# Patient Record
Sex: Male | Born: 1965 | Race: White | Hispanic: No | Marital: Single | Smoking: Former smoker
Health system: Southern US, Community
[De-identification: ages and names within clinical notes are randomized; demographics above are authoritative.]

## PROBLEM LIST (undated history)

## (undated) DIAGNOSIS — I10 Essential (primary) hypertension: Secondary | ICD-10-CM

## (undated) DIAGNOSIS — J45909 Unspecified asthma, uncomplicated: Secondary | ICD-10-CM

## (undated) HISTORY — PX: TONSILLECTOMY: SUR1361

## (undated) HISTORY — PX: MYRINGOTOMY: SUR874

## (undated) HISTORY — PX: ADENOIDECTOMY: SUR15

---

## 2009-07-26 ENCOUNTER — Emergency Department (HOSPITAL_COMMUNITY): Admission: EM | Admit: 2009-07-26 | Discharge: 2009-07-27 | Payer: Self-pay | Admitting: Emergency Medicine

## 2011-05-18 ENCOUNTER — Emergency Department (HOSPITAL_COMMUNITY): Payer: Self-pay

## 2011-05-18 ENCOUNTER — Observation Stay (HOSPITAL_COMMUNITY)
Admission: EM | Admit: 2011-05-18 | Discharge: 2011-05-18 | Disposition: A | Discharge status: Home / Self Care | Payer: Self-pay | Attending: Emergency Medicine | Admitting: Emergency Medicine

## 2011-05-18 DIAGNOSIS — R079 Chest pain, unspecified: Secondary | ICD-10-CM | POA: Insufficient documentation

## 2011-05-18 DIAGNOSIS — K2100 Gastro-esophageal reflux disease with esophagitis, without bleeding: Principal | ICD-10-CM | POA: Insufficient documentation

## 2011-05-18 LAB — POCT I-STAT TROPONIN I: Troponin i, poc: 0.03 ng/mL (ref 0.00–0.08)

## 2011-05-18 LAB — DIFFERENTIAL
Eosinophils Relative: 3 % (ref 0–5)
Lymphocytes Relative: 16 % (ref 12–46)
Lymphs Abs: 1.2 10*3/uL (ref 0.7–4.0)
Monocytes Relative: 9 % (ref 3–12)

## 2011-05-18 LAB — CBC
HCT: 46.2 % (ref 39.0–52.0)
MCV: 95.3 fL (ref 78.0–100.0)
RBC: 4.85 MIL/uL (ref 4.22–5.81)
RDW: 12.1 % (ref 11.5–15.5)
WBC: 7.1 10*3/uL (ref 4.0–10.5)

## 2011-05-18 LAB — COMPREHENSIVE METABOLIC PANEL
BUN: 13 mg/dL (ref 6–23)
CO2: 25 mEq/L (ref 19–32)
Chloride: 105 mEq/L (ref 96–112)
Creatinine, Ser: 0.95 mg/dL (ref 0.50–1.35)
GFR calc non Af Amer: >90 mL/min (ref >90)
Total Bilirubin: 1.2 mg/dL (ref 0.3–1.2)

## 2011-05-18 LAB — LIPASE, BLOOD: Lipase: 7 U/L — ABNORMAL LOW (ref 11–59)

## 2011-05-18 IMAGING — CT CT HEART MORP W/ CTA COR W/ SCORE W/ CA W/CM &/OR W/O CM
2 of 4 series · 11 of 20 positions shown, 12 images · IV contrast (omnipaque)
Comparison: 05/18/2011 chest radiograph.

***ADDENDUM*** CREATED: 05/18/2011 [DATE]

I have reviewed these images in PACs and on the [employer] workstation, and agree with Dr. Vacante
interpretation.
INDICATION: Chest pain.  Acute coronary syndrome
CT ANGIOGRAPHY OF THE HEART, CORONARY ARTERY, STRUCTURE, AND
MORPHOLOGY
CONTRAST: 80mL OMNIPAQUE IOHEXOL 350 MG/ML IV SOLN
TECHNIQUE: CT angiography of the coronary vessels was performed on
a 256 channel system using prospective ECG gating.  A scout and
noncontrast exam (for calcium scoring) were performed.  Circulation
time was measured using a test bolus.  Coronary CTA was performed
with sub mm slice collimation during portions of the cardiac cycle
after prior injection of iodinated contrast.  Imaging post
processing was performed on an independent workstation creating
multiplanar and 3-D images, and quantitative analysis of the heart
and coronary arteries.  Note that this exam targets the heart and
the chest was not imaged in its entirety.
PREMEDICATION:
Lopressor 50 mg, P.O.
Lopressor five mg, IV
Nitroglycerin 0.4 mcg, sublingual.

[Series 2: calcium score · axial · 0.49mm/px · z∈[-233,-115]mm · 3 of 48 slices shown, 4 images]
[im 1/48  vessel]
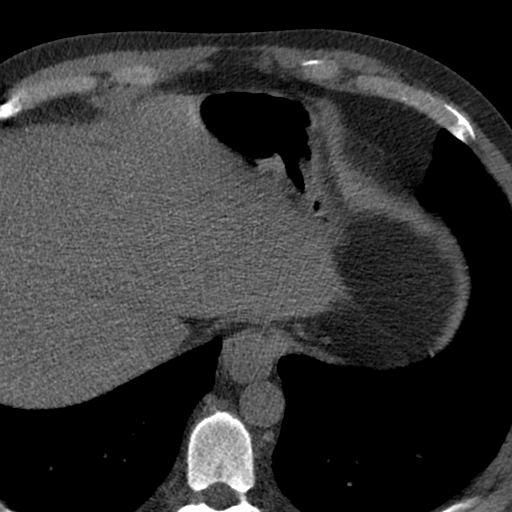
[im 1/48  lung]
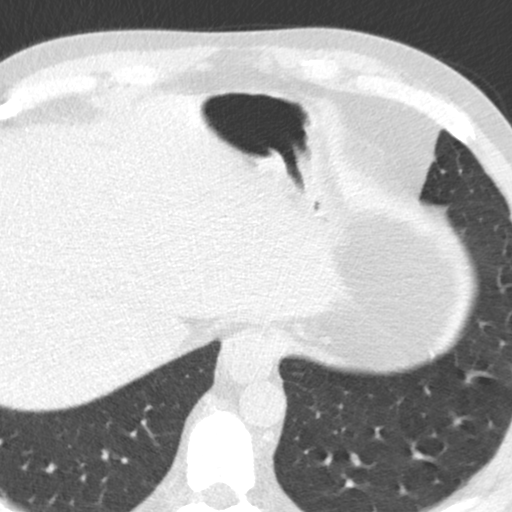
[im 24/48  vessel]
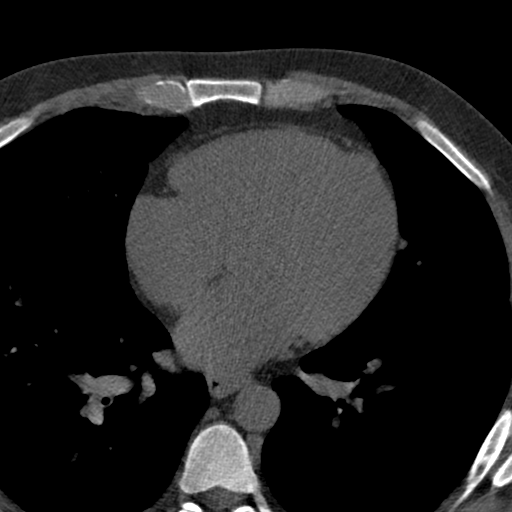
[im 48/48  vessel]
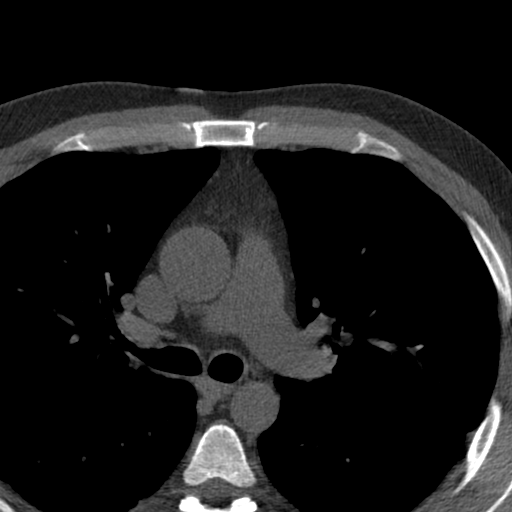

[Series 10: w/o edge corr., 78.0% · axial · non-contrast · 0.49mm/px · z∈[-226,-123]mm · 8 of 276 slices shown]
[im 23/276  lung]
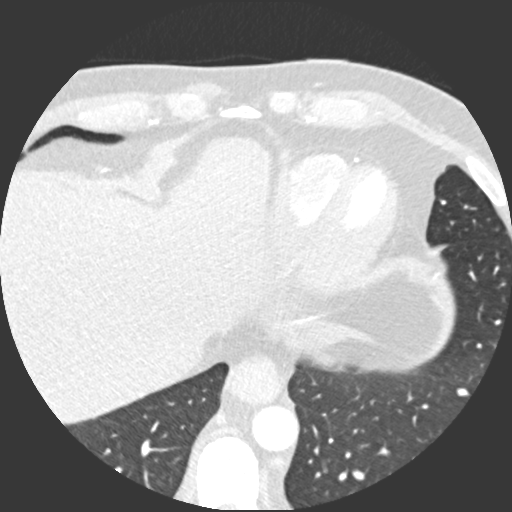
[im 69/276  lung]
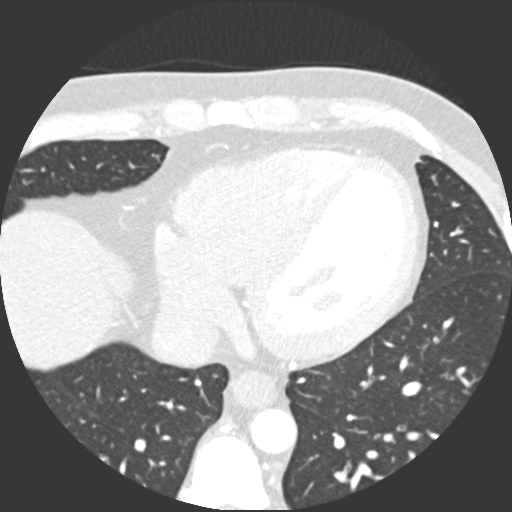
[im 92/276  lung]
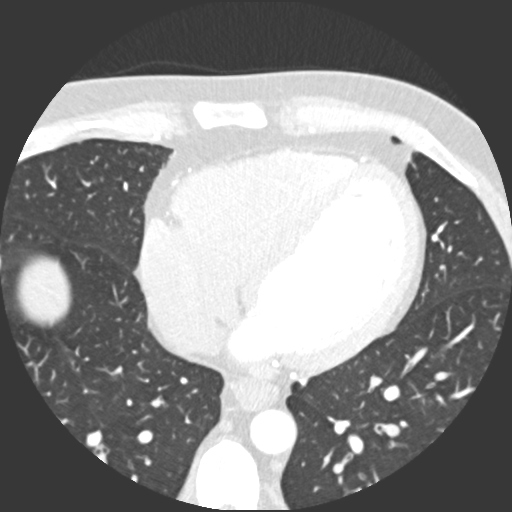
[im 115/276  lung]
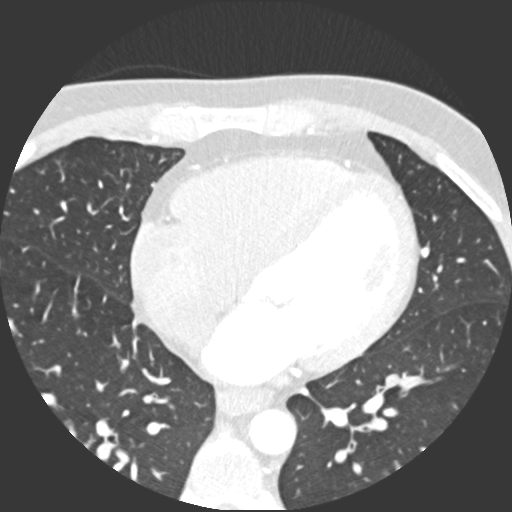
[im 161/276  lung]
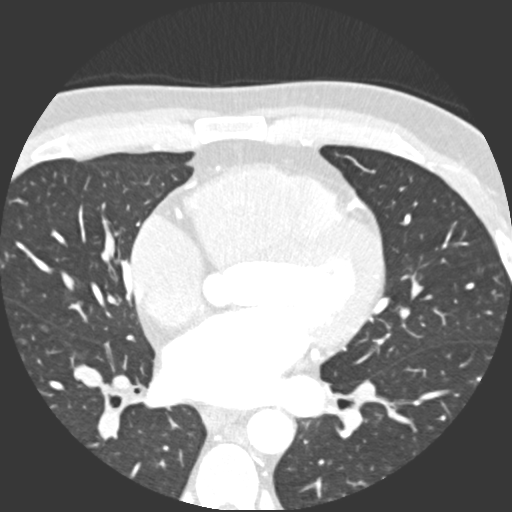
[im 184/276  lung]
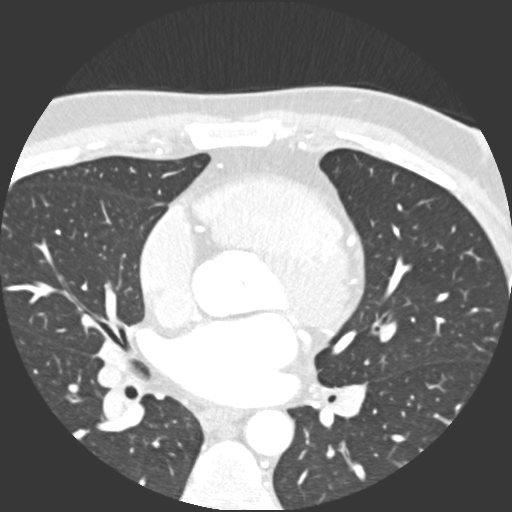
[im 207/276  lung]
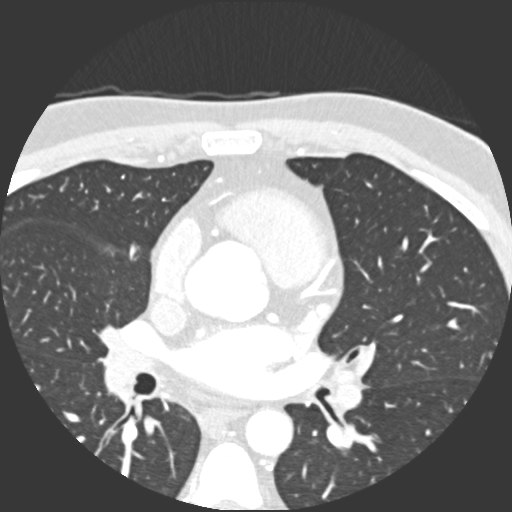
[im 253/276  lung]
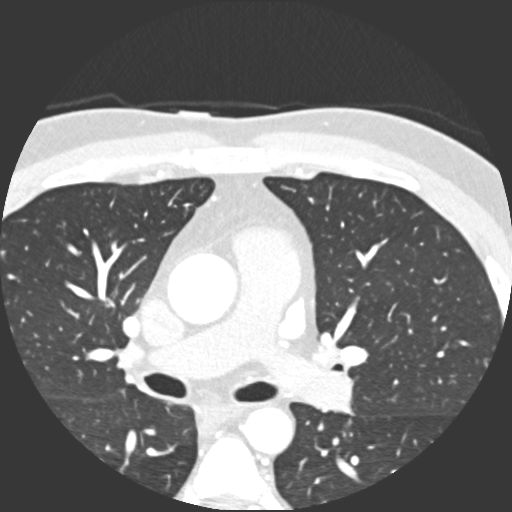

[11 of 20 positions shown; findings below may reference images not displayed]

FINDINGS: Technical quality:  Excellent.

Heart rate:  51

CORONARY ARTERIES:
Left main coronary artery:  Widely patent.
Left anterior descending:  Widely patent.
Left circumflex:  Widely patent.
Right coronary artery:  Diminutive but widely patent.
Posterior descending artery:  Widely patent.
Dominance:  Left

CORONARY CALCIUM:
Total Agatston Score:
[HOSPITAL] percentile:  16

CARDIAC MEASUREMENTS:
Interventricular septum (6 - 12 mm):  9 mm
LV posterior wall (6 - 12 mm):  9 mm
LV diameter in diastole (35 - 52 mm):  44 mm

AORTA AND PULMONARY MEASUREMENTS:
Aortic root (21 - 40 mm):
            29 mm  at the annulus
            39 mm  at the sinuses of Valsalva
            28 mm.  at the sinotubular junction
Ascending aorta ( <  40 mm):  30 mm

Descending aorta ( <  40 mm):  21 mm
Main pulmonary artery:  ( <  30 mm):  26 mm

EXTRACARDIAC FINDINGS:
Scattered areas of atelectasis in the lungs.  Thoracic spine
appears within normal limits.  No pericardial or pleural effusion
is identified.  Small hiatal hernia with thickening of the distal
esophagus, suggesting reflux esophagitis.  Consider endoscopic
correlation.
IMPRESSION: 1.  No significant coronary artery disease.  The patient's total
coronary artery calcium score is 0.1, which is 16 percentile for
patient's matched age and gender.
2.  Widely patent coronary arteries.  Thickening of the distal
esophagus with a small hiatal hernia.  This raises the possibility
of reflux esophagitis.  Consider endoscopic correlation.
3.  Left coronary artery dominance.

Report was called to Dr. Peachy Tia at 1885 hours on 05/18/2011.

## 2011-05-18 IMAGING — CR DG CHEST 2V
2 series · 2 of 2 positions shown · non-contrast
Comparison: None.

CLINICAL DATA: 45-year-old male with chest pain, vomiting, tingling
into the left shoulder.

CHEST - 2 VIEW

[w chest pa]
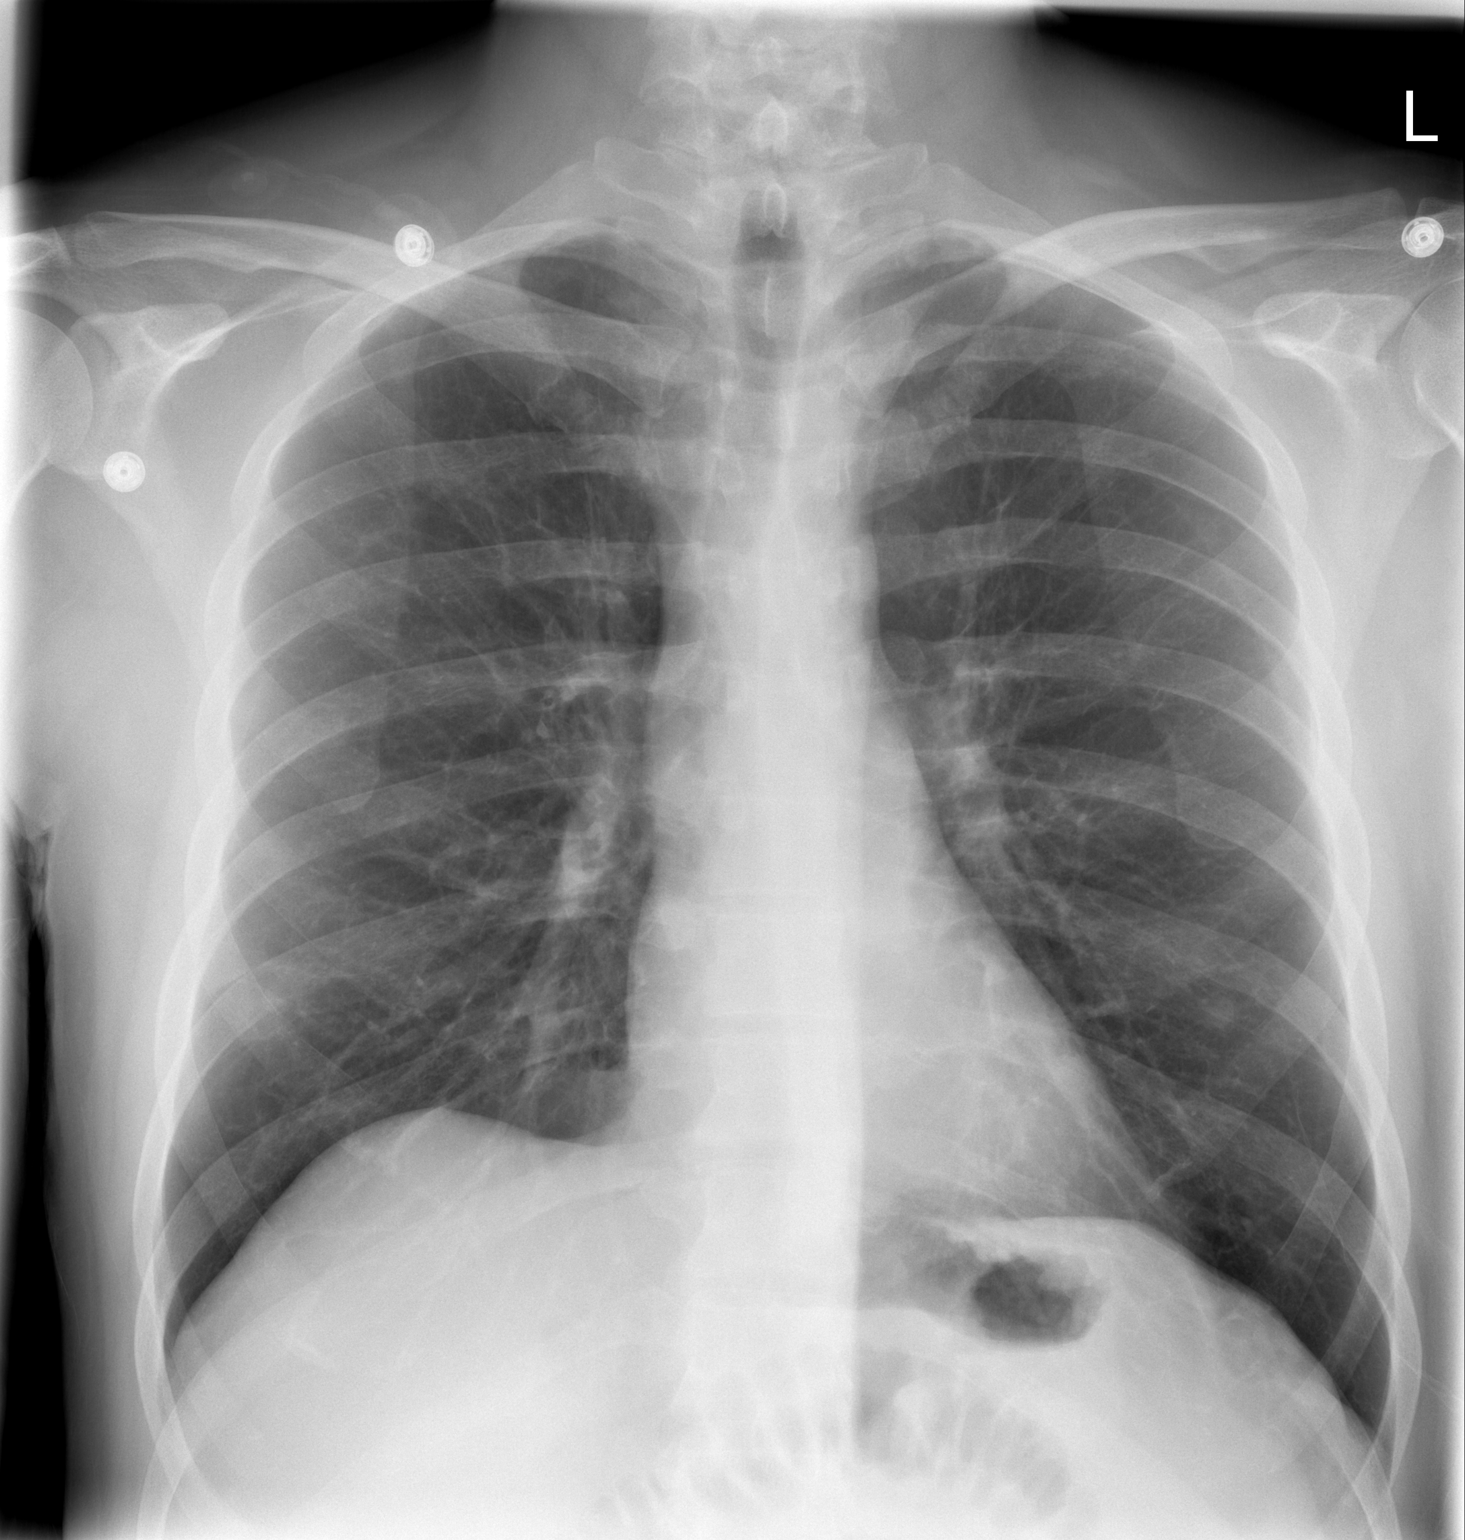

[w chest lat]
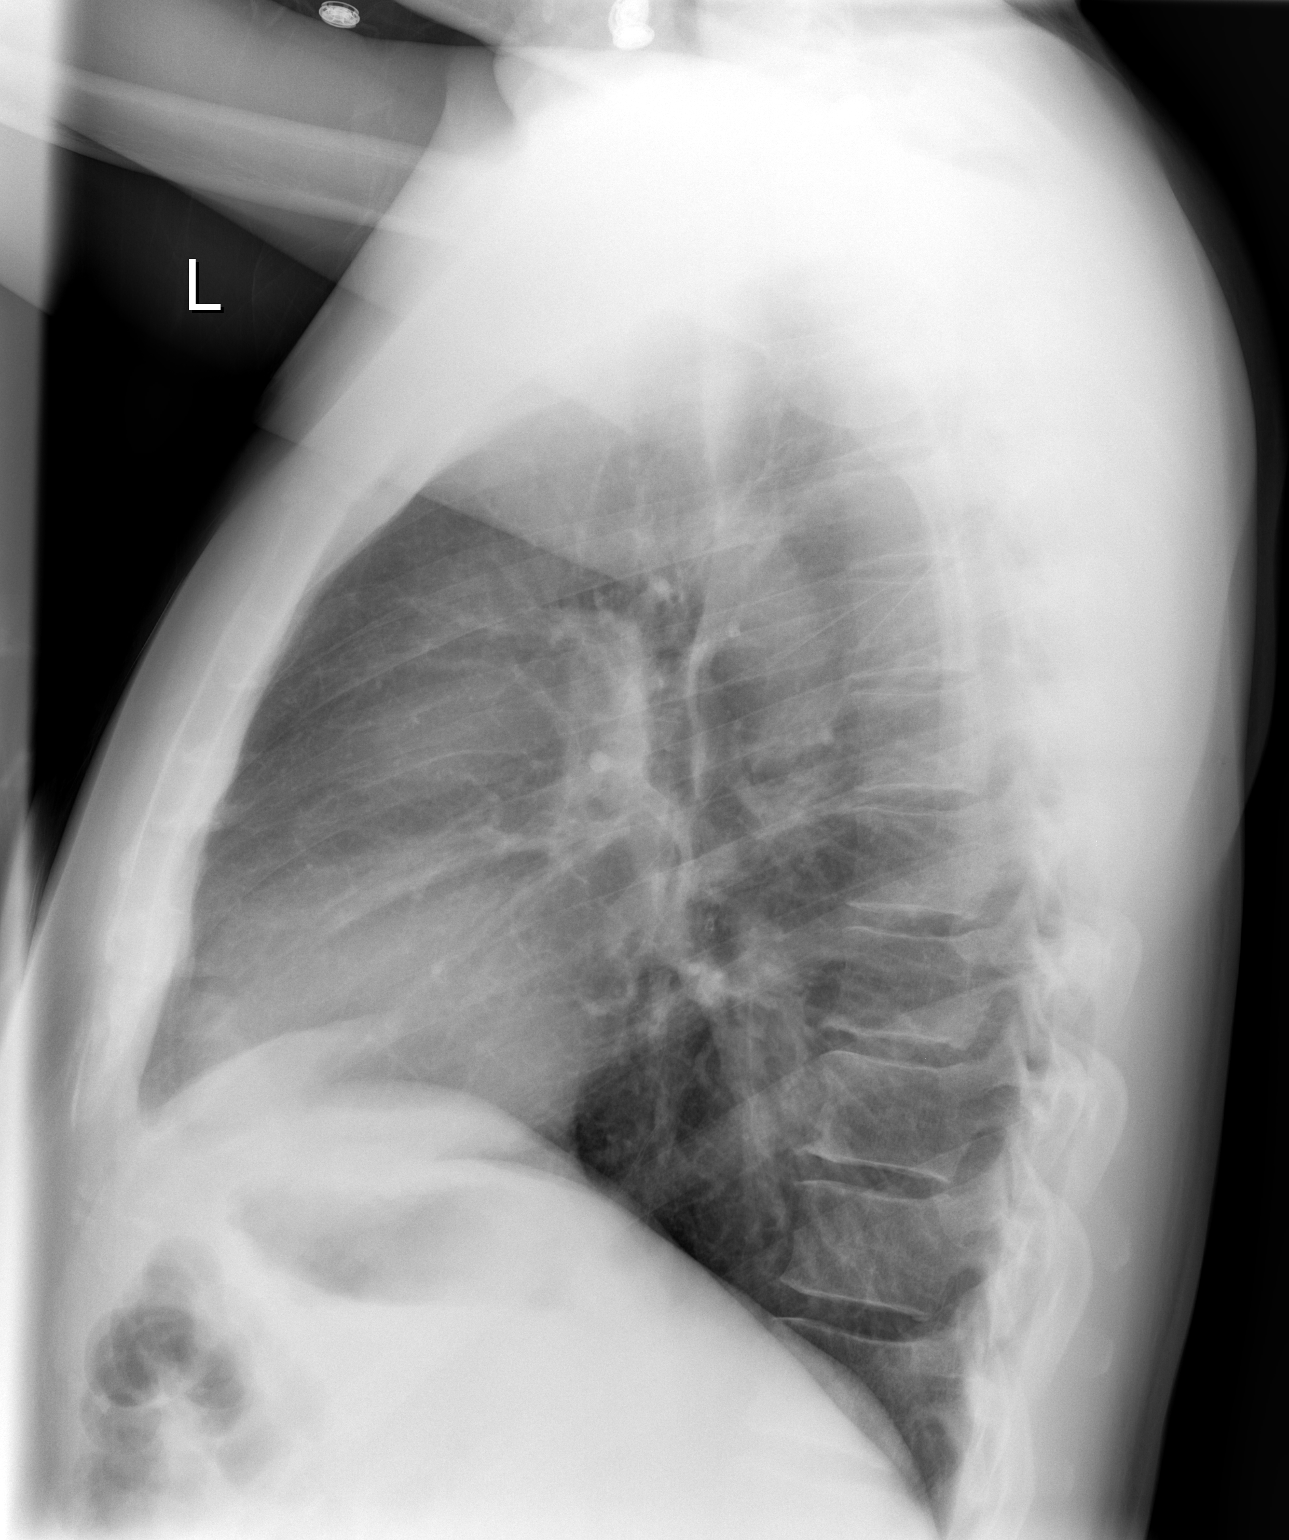

[2 of 2 positions shown; findings below may reference images not displayed]

FINDINGS: Bilateral nipple shadows suspected.  Lung volumes within
normal limits.  Mild elevation of the right hemidiaphragm. Normal
cardiac size and mediastinal contours.  Visualized tracheal air
column is within normal limits.  No pneumothorax, pulmonary edema,
pleural effusion or confluent pulmonary opacity. No acute osseous
abnormality identified.
IMPRESSION: No acute cardiopulmonary abnormality.  Bilateral nipple shadows
suspected.

## 2011-05-18 MED ORDER — IOHEXOL 350 MG/ML SOLN
80.0000 mL | Freq: Once | INTRAVENOUS | Status: AC | PRN
  Administered 2011-05-18: 80 mL via INTRAVENOUS

## 2013-11-19 ENCOUNTER — Encounter (HOSPITAL_COMMUNITY): Payer: Self-pay | Admitting: Emergency Medicine

## 2013-11-19 ENCOUNTER — Emergency Department (HOSPITAL_COMMUNITY): Payer: Self-pay

## 2013-11-19 ENCOUNTER — Emergency Department (HOSPITAL_COMMUNITY)
Admission: EM | Admit: 2013-11-19 | Discharge: 2013-11-19 | Disposition: A | Discharge status: Home / Self Care | Payer: Self-pay | Attending: Emergency Medicine | Admitting: Emergency Medicine

## 2013-11-19 DIAGNOSIS — R202 Paresthesia of skin: Secondary | ICD-10-CM

## 2013-11-19 DIAGNOSIS — R071 Chest pain on breathing: Secondary | ICD-10-CM | POA: Insufficient documentation

## 2013-11-19 DIAGNOSIS — R209 Unspecified disturbances of skin sensation: Secondary | ICD-10-CM | POA: Insufficient documentation

## 2013-11-19 DIAGNOSIS — Z87891 Personal history of nicotine dependence: Secondary | ICD-10-CM | POA: Insufficient documentation

## 2013-11-19 DIAGNOSIS — Z792 Long term (current) use of antibiotics: Secondary | ICD-10-CM | POA: Insufficient documentation

## 2013-11-19 DIAGNOSIS — IMO0002 Reserved for concepts with insufficient information to code with codable children: Secondary | ICD-10-CM | POA: Insufficient documentation

## 2013-11-19 DIAGNOSIS — J45909 Unspecified asthma, uncomplicated: Secondary | ICD-10-CM | POA: Insufficient documentation

## 2013-11-19 DIAGNOSIS — R0789 Other chest pain: Secondary | ICD-10-CM

## 2013-11-19 DIAGNOSIS — I10 Essential (primary) hypertension: Secondary | ICD-10-CM | POA: Insufficient documentation

## 2013-11-19 HISTORY — DX: Unspecified asthma, uncomplicated: J45.909

## 2013-11-19 HISTORY — DX: Essential (primary) hypertension: I10

## 2013-11-19 LAB — CBC
HCT: 44.3 % (ref 39.0–52.0)
Hemoglobin: 15.6 g/dL (ref 13.0–17.0)
MCH: 35.5 pg — ABNORMAL HIGH (ref 26.0–34.0)
MCHC: 35.2 g/dL (ref 30.0–36.0)
MCV: 100.7 fL — AB (ref 78.0–100.0)
PLATELETS: 268 10*3/uL (ref 150–400)
RBC: 4.4 MIL/uL (ref 4.22–5.81)
RDW: 11.8 % (ref 11.5–15.5)
WBC: 9.8 10*3/uL (ref 4.0–10.5)

## 2013-11-19 LAB — HEPATIC FUNCTION PANEL
ALBUMIN: 4.1 g/dL (ref 3.5–5.2)
ALK PHOS: 80 U/L (ref 39–117)
ALT: 33 U/L (ref 0–53)
AST: 29 U/L (ref 0–37)
BILIRUBIN TOTAL: 0.2 mg/dL — AB (ref 0.3–1.2)
Bilirubin, Direct: <0.2 mg/dL (ref 0.0–0.3)
TOTAL PROTEIN: 7.2 g/dL (ref 6.0–8.3)

## 2013-11-19 LAB — D-DIMER, QUANTITATIVE: D-Dimer, Quant: 0.27 ug/mL-FEU (ref 0.00–0.48)

## 2013-11-19 LAB — BASIC METABOLIC PANEL
BUN: 9 mg/dL (ref 6–23)
CHLORIDE: 100 meq/L (ref 96–112)
CO2: 20 meq/L (ref 19–32)
CREATININE: 0.77 mg/dL (ref 0.50–1.35)
Calcium: 8.8 mg/dL (ref 8.4–10.5)
GFR calc non Af Amer: >90 mL/min (ref >90)
Glucose, Bld: 134 mg/dL — ABNORMAL HIGH (ref 70–99)
POTASSIUM: 3.7 meq/L (ref 3.7–5.3)
SODIUM: 137 meq/L (ref 137–147)

## 2013-11-19 LAB — I-STAT TROPONIN, ED
TROPONIN I, POC: 0.01 ng/mL (ref 0.00–0.08)
TROPONIN I, POC: 0.01 ng/mL (ref 0.00–0.08)

## 2013-11-19 LAB — ETHANOL: ALCOHOL ETHYL (B): 221 mg/dL — AB (ref 0–11)

## 2013-11-19 IMAGING — CR DG CHEST 2V
2 series · 2 of 2 positions shown · non-contrast
Comparison: Chest radiograph and coronary CTA performed 05/18/2011

CLINICAL DATA: Sudden onset of chest pain. Left hand and arm
numbness.

EXAM:
CHEST  2 VIEW

[w chest pa]
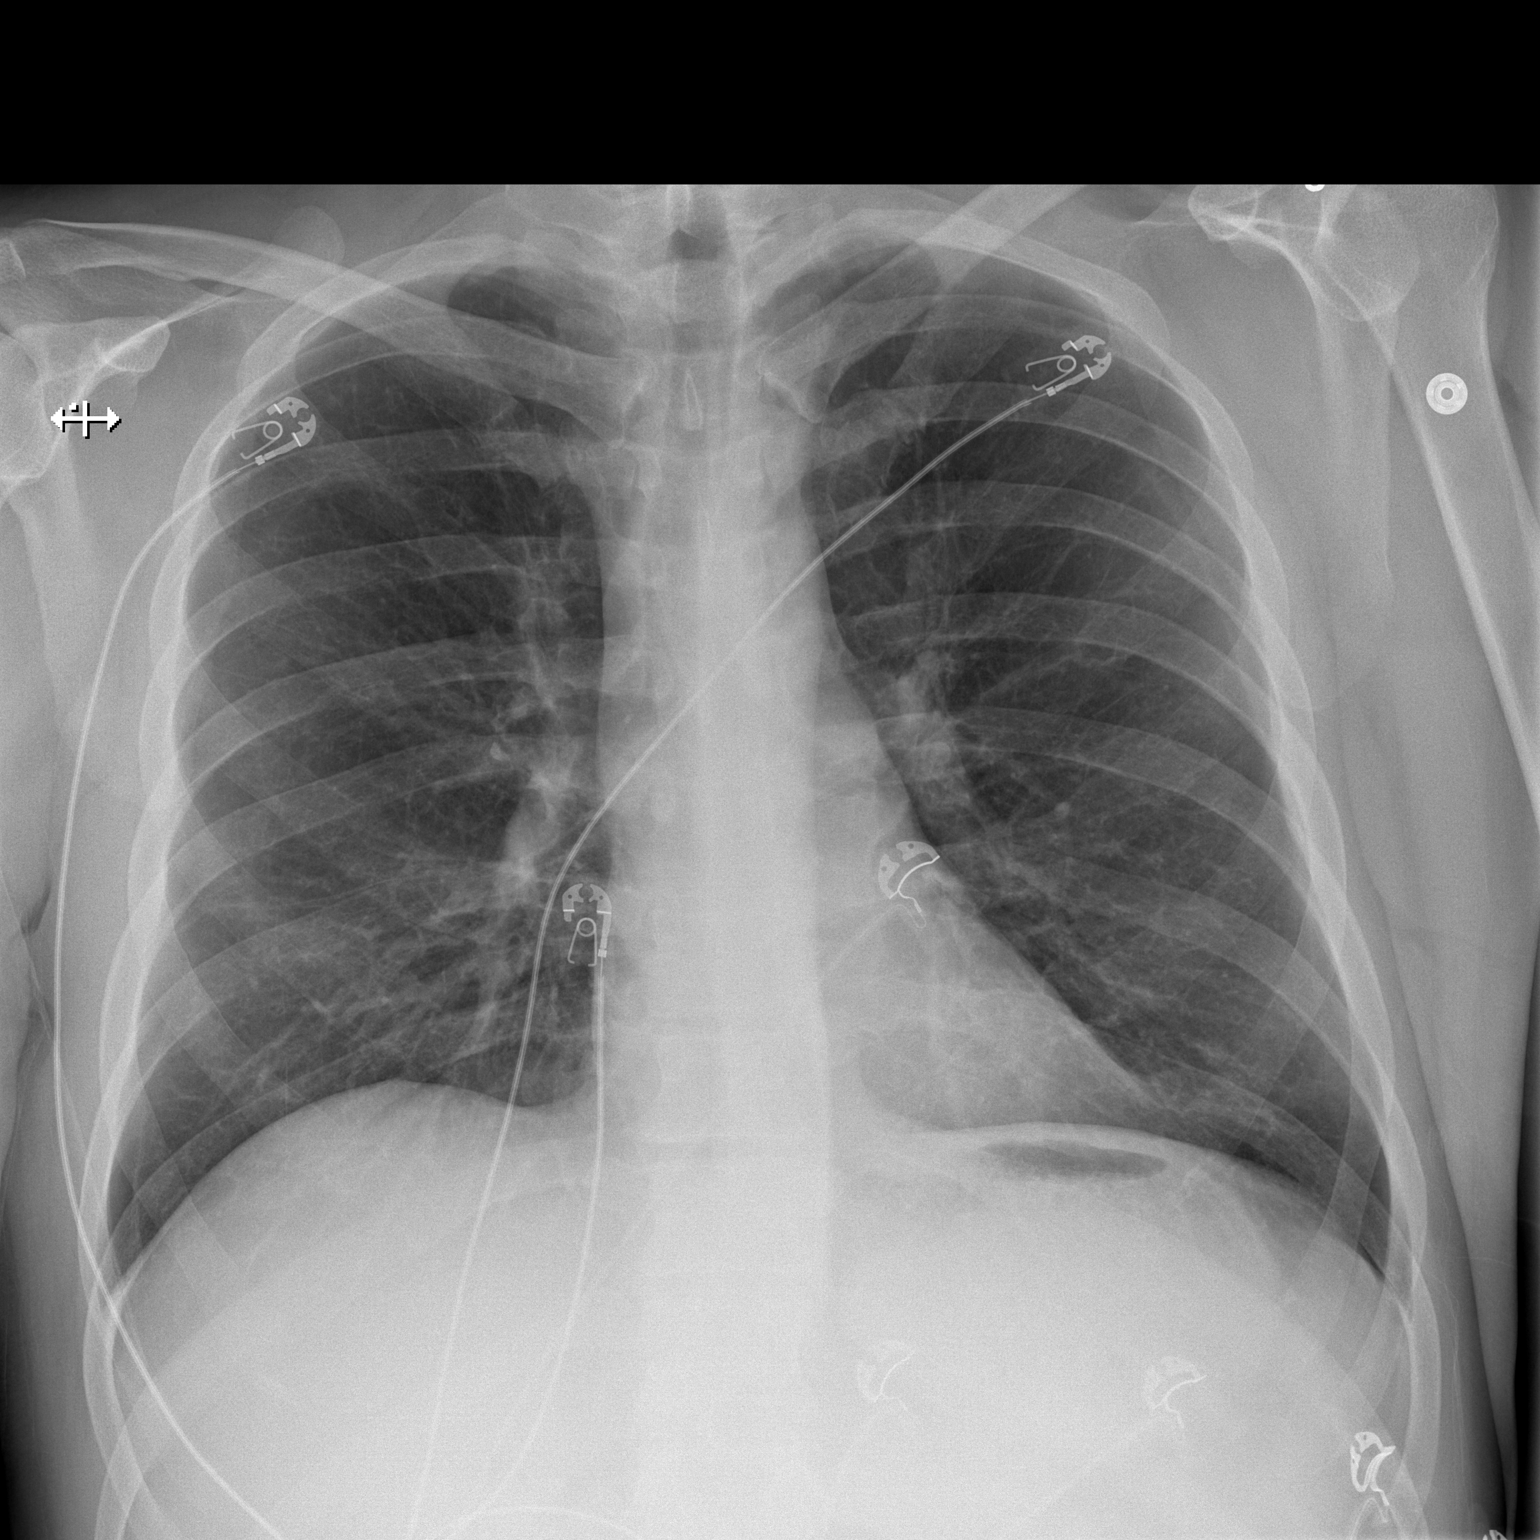

[w chest lat]
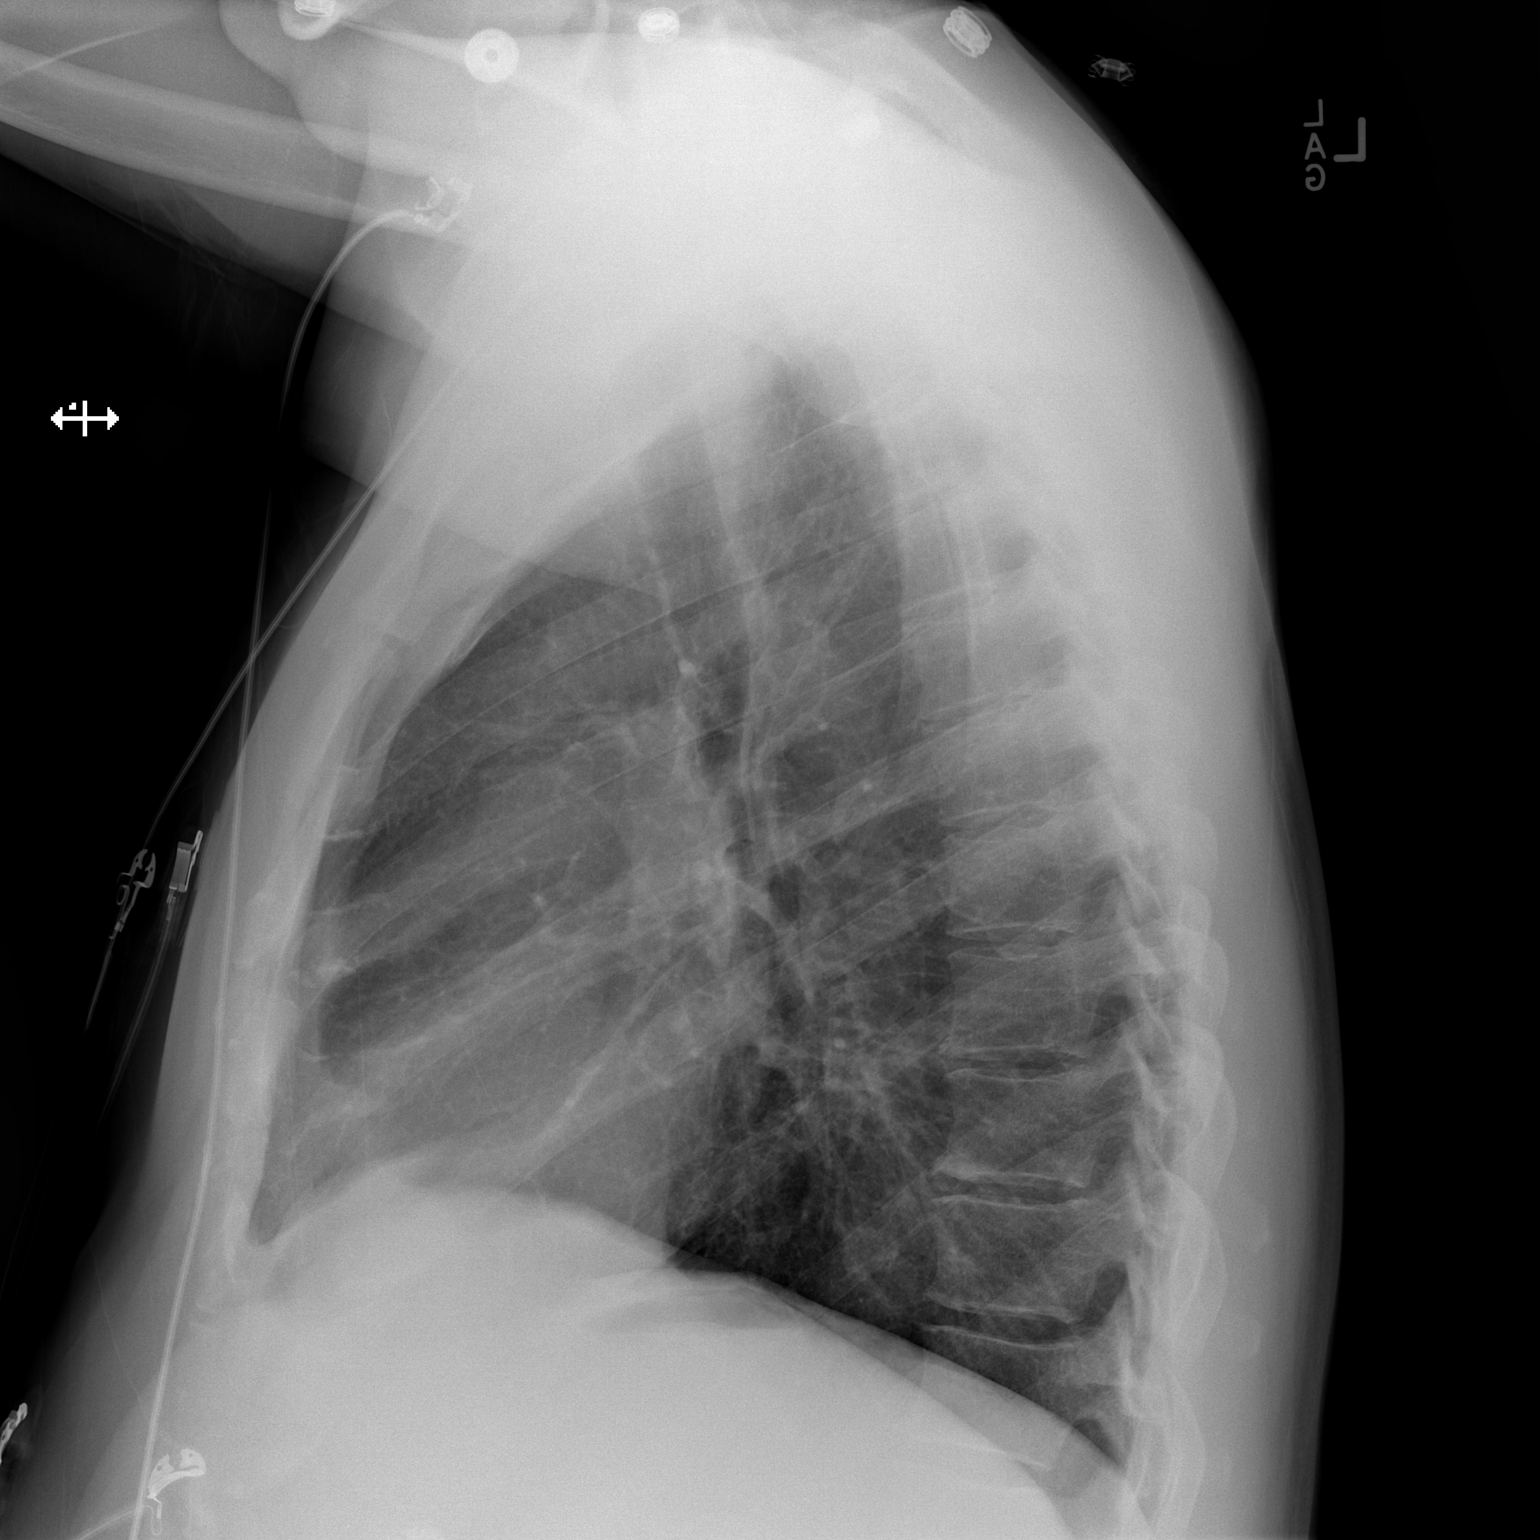

[2 of 2 positions shown; findings below may reference images not displayed]

FINDINGS: The lungs are well-aerated and clear. There is no evidence of focal
opacification, pleural effusion or pneumothorax.

The heart is normal in size; the mediastinal contour is within
normal limits. No acute osseous abnormalities are seen.
IMPRESSION: No acute cardiopulmonary process seen. No displaced rib fractures
identified.

## 2013-11-19 MED ORDER — METHOCARBAMOL 500 MG PO TABS: 500.0000 mg | ORAL_TABLET | Freq: Three times a day (TID) | ORAL | Status: AC | PRN

## 2013-11-19 MED ORDER — IBUPROFEN 600 MG PO TABS: 600.0000 mg | ORAL_TABLET | Freq: Four times a day (QID) | ORAL | Status: AC | PRN

## 2013-11-19 MED ORDER — KETOROLAC TROMETHAMINE 30 MG/ML IJ SOLN
30.0000 mg | Freq: Once | INTRAMUSCULAR | Status: AC
  Administered 2013-11-19: 30 mg via INTRAVENOUS
  Filled 2013-11-19: qty 1

## 2013-11-19 MED ORDER — ALBUTEROL SULFATE (2.5 MG/3ML) 0.083% IN NEBU
5.0000 mg | INHALATION_SOLUTION | Freq: Once | RESPIRATORY_TRACT | Status: AC
  Administered 2013-11-19: 5 mg via RESPIRATORY_TRACT
  Filled 2013-11-19: qty 6

## 2013-11-19 NOTE — ED Notes (Addendum)
Pt presents to ED with c/o chest pain with numbness to left hand.  Pt reports that he started having "sharp, squeezing feeling" to left chest 20 minutes ago.  Pt also reports shortness of breath associated with chest pain.

## 2013-11-19 NOTE — Discharge Instructions (Signed)
Followup with a cardiologist as needed. Take pain medications as prescribed. If the numbness in your left hand continues to see a Hydrographic surveyorhand surgeon. Return for worsening symptoms or any concerns.  Chest Wall Pain Chest wall pain is pain in or around the bones and muscles of your chest. It may take up to 6 weeks to get better. It may take longer if you must stay physically active in your work and activities.  CAUSES  Chest wall pain may happen on its own. However, it may be caused by:  A viral illness like the flu.  Injury.  Coughing.  Exercise.  Arthritis.  Fibromyalgia.  Shingles. HOME CARE INSTRUCTIONS   Avoid overtiring physical activity. Try not to strain or perform activities that cause pain. This includes any activities using your chest or your abdominal and side muscles, especially if heavy weights are used.  Put ice on the sore area.  Put ice in a plastic bag.  Place a towel between your skin and the bag.  Leave the ice on for 15-20 minutes per hour while awake for the first 2 days.  Only take over-the-counter or prescription medicines for pain, discomfort, or fever as directed by your caregiver. SEEK IMMEDIATE MEDICAL CARE IF:   Your pain increases, or you are very uncomfortable.  You have a fever.  Your chest pain becomes worse.  You have new, unexplained symptoms.  You have nausea or vomiting.  You feel sweaty or lightheaded.  You have a cough with phlegm (sputum), or you cough up blood. MAKE SURE YOU:   Understand these instructions.  Will watch your condition.  Will get help right away if you are not doing well or get worse. Document Released: 07/10/2005 Document Revised: 10/02/2011 Document Reviewed: 03/06/2011 [facility] Patient Information 2014 [place], [place].  Paresthesia Paresthesia is an abnormal burning or prickling sensation. This sensation is generally felt in the hands, arms, legs, or feet. However, it may occur in any part of the body. It  is usually not painful. The feeling may be described as:  Tingling or numbness.  "Pins and needles."  Skin crawling.  Buzzing.  Limbs "falling asleep."  Itching. Most people experience temporary (transient) paresthesia at some time in their lives. CAUSES  Paresthesia may occur when you breathe too quickly (hyperventilation). It can also occur without any apparent cause. Commonly, paresthesia occurs when pressure is placed on a nerve. The feeling quickly goes away once the pressure is removed. For some people, however, paresthesia is a long-lasting (chronic) condition caused by an underlying disorder. The underlying disorder may be:  A traumatic, direct injury to nerves. Examples include a:  Broken (fractured) neck.  Fractured skull.  A disorder affecting the brain and spinal cord (central nervous system). Examples include:  Transverse myelitis.  Encephalitis.  Transient ischemic attack.  Multiple sclerosis.  Stroke.  Tumor or blood vessel problems, such as an arteriovenous malformation pressing against the brain or spinal cord.  A condition that damages the peripheral nerves (peripheral neuropathy). Peripheral nerves are not part of the brain and spinal cord. These conditions include:  Diabetes.  Peripheral vascular disease.  Nerve entrapment syndromes, such as carpal tunnel syndrome.  Shingles.  Hypothyroidism.  Vitamin B12 deficiencies.  Alcoholism.  Heavy metal poisoning (lead, arsenic).  Rheumatoid arthritis.  Systemic lupus erythematosus. DIAGNOSIS  Your caregiver will attempt to find the underlying cause of your paresthesia. Your caregiver may:  Take your medical history.  Perform a physical exam.  Order various lab tests.  Order  imaging tests. TREATMENT  Treatment for paresthesia depends on the underlying cause. HOME CARE INSTRUCTIONS  Avoid drinking alcohol.  You may consider massage or acupuncture to help relieve your  symptoms.  Keep all follow-up appointments as directed by your caregiver. SEEK IMMEDIATE MEDICAL CARE IF:   You feel weak.  You have trouble walking or moving.  You have problems with speech or vision.  You feel confused.  You cannot control your bladder or bowel movements.  You feel numbness after an injury.  You faint.  Your burning or prickling feeling gets worse when walking.  You have pain, cramps, or dizziness.  You develop a rash. MAKE SURE YOU:  Understand these instructions.  Will watch your condition.  Will get help right away if you are not doing well or get worse. Document Released: 06/30/2002 Document Revised: 10/02/2011 Document Reviewed: 03/31/2011 [facility]Exit[facility] Patient Information 2014 [place], [place].

## 2013-11-19 NOTE — ED Provider Notes (Signed)
CSN: [id number]     Arrival date & time 11/19/13  0100 History   First MD Initiated Contact with Patient 11/19/13 0110     Chief Complaint  Patient presents with  . Chest Pain  . Left Hand Numbness      (Consider location/radiation/quality/duration/timing/severity/associated sxs/prior Treatment) HPI Patient admits to drinking multiple beers this evening. States he was lying down when he began to have sharp left-sided chest pain after midnight. The pain did not radiate. It is worse with movement and deep breathing. He denies shortness of breath and cough. States he also noticed left hand paresthesias. He had similar paresthesias bilateral lower extremities earlier this morning. Patient has had no extended travel or recent surgeries. He denies any lower extremity swelling or pain. Patient denies any chest trauma. Past Medical History  Diagnosis Date  . Hypertension   . Asthma    Past Surgical History  Procedure Laterality Date  . Tonsillectomy     History reviewed. No pertinent family history. History  Substance Use Topics  . Smoking status: Former Games developermoker  . Smokeless tobacco: Never Used  . Alcohol Use: Yes    Review of Systems  Constitutional: Negative for fever and chills.  Respiratory: Negative for cough and shortness of breath.   Cardiovascular: Positive for chest pain. Negative for palpitations and leg swelling.  Gastrointestinal: Negative for nausea, vomiting, abdominal pain and diarrhea.  Musculoskeletal: Negative for back pain, neck pain and neck stiffness.  Skin: Negative for rash and wound.  Neurological: Positive for numbness. Negative for dizziness, syncope, weakness and headaches.  All other systems reviewed and are negative.     Allergies  Review of patient's allergies indicates no known allergies.  Home Medications   Prior to Admission medications   Medication Sig Start Date End Date Taking? Authorizing Provider  amoxicillin (AMOXIL) 500 MG capsule Take  500 mg by mouth 3 (three) times daily.   Yes Historical Provider, MD  cyclobenzaprine (FLEXERIL) 10 MG tablet Take 10 mg by mouth 3 (three) times daily as needed for muscle spasms.   Yes Historical Provider, MD  ibuprofen (ADVIL,MOTRIN) 200 MG tablet Take 800 mg by mouth every 6 (six) hours as needed for moderate pain.   Yes Historical Provider, MD  naproxen sodium (ANAPROX) 220 MG tablet Take 220 mg by mouth 2 (two) times daily as needed (pain).   Yes Historical Provider, MD  predniSONE (DELTASONE) 5 MG tablet Take 5 mg by mouth See admin instructions. 4 tabs for 2 days, 3 tabs for 2 days, 2 tabs for 2 days, then 1 tab for 3 days.   Yes Historical Provider, MD   BP 137/92  Pulse 102  Temp(Src) 98.2 F (36.8 C) (Oral)  Resp 18  SpO2 98% Physical Exam  Nursing note and vitals reviewed. Constitutional: He is oriented to person, place, and time. He appears well-developed and well-nourished. No distress.  HENT:  Head: Normocephalic and atraumatic.  Mouth/Throat: Oropharynx is clear and moist.  Eyes: EOM are normal. Pupils are equal, round, and reactive to light.  Neck: Normal range of motion. Neck supple.  Cardiovascular: Normal rate and regular rhythm.   Pulmonary/Chest: Effort normal and breath sounds normal. No respiratory distress. He has no wheezes. He has no rales. He exhibits tenderness (chest pain completely reproduced with palpation of the left upper chest. There is no crepitance or deformity.).  Abdominal: Soft. Bowel sounds are normal. He exhibits no distension and no mass. There is no tenderness. There is no rebound and  no guarding.  Musculoskeletal: Normal range of motion. He exhibits no edema and no tenderness.  No calf swelling or tenderness. Distal pulses intact.  Neurological: He is alert and oriented to person, place, and time.  Decreased sensation in the glovelike pattern up to the distal radius. Questionable decreased grip strength on the left though poor effort. Moves all  other extremities without deficit. Sensation grossly intact otherwise.  Skin: Skin is warm and dry. No rash noted. No erythema.  Psychiatric: He has a normal mood and affect. His behavior is normal.    ED Course  Procedures (including critical care time) Labs Review Labs Reviewed  CBC - Abnormal; Notable for the following:    MCV 100.7 (*)    MCH 35.5 (*)    All other components within normal limits  BASIC METABOLIC PANEL - Abnormal; Notable for the following:    Glucose, Bld 134 (*)    All other components within normal limits  HEPATIC FUNCTION PANEL - Abnormal; Notable for the following:    Total Bilirubin 0.2 (*)    All other components within normal limits  ETHANOL - Abnormal; Notable for the following:    Alcohol, Ethyl (B) 221 (*)    All other components within normal limits  D-DIMER, QUANTITATIVE  I-STAT TROPOININ, ED    Imaging Review Dg Chest 2 View  11/19/2013   CLINICAL DATA:  Sudden onset of chest pain. Left hand and arm numbness.  EXAM: CHEST  2 VIEW  COMPARISON:  Chest radiograph and coronary CTA performed 05/18/2011  FINDINGS: The lungs are well-aerated and clear. There is no evidence of focal opacification, pleural effusion or pneumothorax.  The heart is normal in size; the mediastinal contour is within normal limits. No acute osseous abnormalities are seen.  IMPRESSION: No acute cardiopulmonary process seen. No displaced rib fractures identified.   Electronically Signed   By: Roanna RaiderJeffery  Chang M.D.   On: 11/19/2013 01:56     EKG Interpretation None      Date: 11/19/2013  Rate: 98  Rhythm: normal sinus rhythm  QRS Axis: normal  Intervals: normal  ST/T Wave abnormalities: normal  Conduction Disutrbances:none  Narrative Interpretation:   Old EKG Reviewed: none available   MDM   Final diagnoses:  None   Patient's exam consistent with chest wall pain. He has a normal EKG and normal troponins x2. 3 the paresthesias he is having in his left hand are likely  unrelated. Advised him to followup with a hand surgeon should the paresthesias continue. Given the patient each and risk factors continues to follow up with a cardiologist. He's been given return precautions and is voiced understanding.     Loren Raceravid Jaylanie Boschee, MD 11/19/13 [phone] [id number]

## 2013-11-28 ENCOUNTER — Encounter: Payer: Self-pay | Admitting: *Deleted

## 2013-12-03 ENCOUNTER — Encounter: Payer: Self-pay | Admitting: Cardiology
